# Patient Record
Sex: Female | Born: 1995 | Race: Black or African American | Hispanic: No | Marital: Single
Health system: Southern US, Community
[De-identification: ages and names within clinical notes are randomized; demographics above are authoritative.]

---

## 2018-12-07 ENCOUNTER — Emergency Department (HOSPITAL_COMMUNITY): Payer: BC Managed Care – PPO

## 2018-12-07 ENCOUNTER — Other Ambulatory Visit: Payer: Self-pay

## 2018-12-07 ENCOUNTER — Emergency Department (HOSPITAL_COMMUNITY)
Admission: EM | Admit: 2018-12-07 | Discharge: 2018-12-07 | Disposition: A | Discharge status: Home / Self Care | Payer: BC Managed Care – PPO | Attending: Emergency Medicine | Admitting: Emergency Medicine

## 2018-12-07 DIAGNOSIS — Y929 Unspecified place or not applicable: Secondary | ICD-10-CM | POA: Insufficient documentation

## 2018-12-07 DIAGNOSIS — Y939 Activity, unspecified: Secondary | ICD-10-CM | POA: Insufficient documentation

## 2018-12-07 DIAGNOSIS — W19XXXA Unspecified fall, initial encounter: Secondary | ICD-10-CM | POA: Diagnosis not present

## 2018-12-07 DIAGNOSIS — Y999 Unspecified external cause status: Secondary | ICD-10-CM | POA: Diagnosis not present

## 2018-12-07 DIAGNOSIS — F10920 Alcohol use, unspecified with intoxication, uncomplicated: Secondary | ICD-10-CM | POA: Insufficient documentation

## 2018-12-07 LAB — COMPREHENSIVE METABOLIC PANEL
ALT: 16 U/L (ref 0–44)
AST: 20 U/L (ref 15–41)
Albumin: 4 g/dL (ref 3.5–5.0)
Alkaline Phosphatase: 83 U/L (ref 38–126)
Anion gap: 11 (ref 5–15)
BUN: 8 mg/dL (ref 6–20)
CO2: 24 mmol/L (ref 22–32)
Calcium: 9.1 mg/dL (ref 8.9–10.3)
Chloride: 105 mmol/L (ref 98–111)
Creatinine, Ser: 0.94 mg/dL (ref 0.44–1.00)
GFR calc Af Amer: >60 mL/min (ref >60)
GFR calc non Af Amer: >60 mL/min (ref >60)
Glucose, Bld: 111 mg/dL — ABNORMAL HIGH (ref 70–99)
Potassium: 3.7 mmol/L (ref 3.5–5.1)
Sodium: 140 mmol/L (ref 135–145)
Total Bilirubin: 0.6 mg/dL (ref 0.3–1.2)
Total Protein: 7.5 g/dL (ref 6.5–8.1)

## 2018-12-07 LAB — CBC WITH DIFFERENTIAL/PLATELET
Abs Immature Granulocytes: 0.04 10*3/uL (ref 0.00–0.07)
Basophils Absolute: 0 10*3/uL (ref 0.0–0.1)
Basophils Relative: 0 %
Eosinophils Absolute: 0 10*3/uL (ref 0.0–0.5)
Eosinophils Relative: 0 %
HCT: 39.9 % (ref 36.0–46.0)
Hemoglobin: 12.8 g/dL (ref 12.0–15.0)
Immature Granulocytes: 1 %
Lymphocytes Relative: 24 %
Lymphs Abs: 2.1 10*3/uL (ref 0.7–4.0)
MCH: 29.4 pg (ref 26.0–34.0)
MCHC: 32.1 g/dL (ref 30.0–36.0)
MCV: 91.5 fL (ref 80.0–100.0)
Monocytes Absolute: 0.8 10*3/uL (ref 0.1–1.0)
Monocytes Relative: 9 %
Neutro Abs: 5.7 10*3/uL (ref 1.7–7.7)
Neutrophils Relative %: 66 %
Platelets: 389 10*3/uL (ref 150–400)
RBC: 4.36 MIL/uL (ref 3.87–5.11)
RDW: 13.4 % (ref 11.5–15.5)
WBC: 8.7 10*3/uL (ref 4.0–10.5)
nRBC: 0 % (ref 0.0–0.2)

## 2018-12-07 LAB — I-STAT BETA HCG BLOOD, ED (MC, WL, AP ONLY): I-stat hCG, quantitative: <5 m[IU]/mL (ref <5)

## 2018-12-07 LAB — ETHANOL: Alcohol, Ethyl (B): 203 mg/dL — ABNORMAL HIGH (ref <10)

## 2018-12-07 NOTE — ED Notes (Signed)
Patient transported to CT 

## 2018-12-07 NOTE — ED Notes (Signed)
Patient ambulated to restroom across hall and back without difficulty. Also given ice water for PO challenge - tolerated well, denies nausea at this time.

## 2018-12-07 NOTE — ED Provider Notes (Signed)
[facility] Provider Note   CSN: [id number] Arrival date & time: 12/07/18  0253     History   Chief Complaint Chief Complaint  Patient presents with   Alcohol Intoxication   Fall    HPI Jasmine Cross is a 23 y.o. female.     The history is provided by the patient and medical records.  Alcohol Intoxication  Fall    Level 5 caveat: Intoxication  23 year old female presenting to the ED acutely intoxicated.  She was with friends celebrating her 75st birthday, reportedly had multiple falls and hit her head twice.  There was no loss of consciousness.  Friends called from ENT to have her transported here due to level of intoxication.  Patient does arouse and open eyes to verbal stimuli.  She is not able to answer questions currently.  No past medical history on file.  There are no active problems to display for this patient.    OB History   No obstetric history on file.      Home Medications    Prior to Admission medications   Not on File    Family History No family history on file.  Social History Social History   Tobacco Use   Smoking status: Not on file  Substance Use Topics   Alcohol use: Not on file   Drug use: Not on file     Allergies   Patient has no allergy information on record.   Review of Systems Review of Systems  Unable to perform ROS: Other     Physical Exam Updated Vital Signs BP 111/83 (BP Location: Right Arm)    Pulse 78    Temp 97.9 F (36.6 C) (Oral)    Resp 14    SpO2 98%   Physical Exam Vitals signs and nursing note reviewed.  Constitutional:      Appearance: She is well-developed.     Comments: Sleeping and snoring on stretcher, opens eyes when name is called but rapidly falls back asleep, appears intoxicated  HENT:     Head: Normocephalic and atraumatic.     Comments: No visible head trauma, no hematoma or laceration noted, small abrasion to right cheek that is scabbed  and appears old, no facial deformities noted, dentition appears intact Eyes:     Conjunctiva/sclera: Conjunctivae normal.     Pupils: Pupils are equal, round, and reactive to light.  Neck:     Comments: c-collar in place Cardiovascular:     Rate and Rhythm: Normal rate and regular rhythm.     Heart sounds: Normal heart sounds.  Pulmonary:     Effort: Pulmonary effort is normal.     Breath sounds: Normal breath sounds.  Abdominal:     General: Bowel sounds are normal.     Palpations: Abdomen is soft.  Musculoskeletal: Normal range of motion.  Skin:    General: Skin is warm and dry.  Neurological:     Comments: Somnolent, intoxicated      ED Treatments / Results  Labs (all labs ordered are listed, but only abnormal results are displayed) Labs Reviewed  COMPREHENSIVE METABOLIC PANEL - Abnormal; Notable for the following components:      Result Value   Glucose, Bld 111 (*)    All other components within normal limits  ETHANOL - Abnormal; Notable for the following components:   Alcohol, Ethyl (B) 203 (*)    All other components within normal limits  CBC WITH DIFFERENTIAL/PLATELET  I-STAT BETA  HCG BLOOD, ED ([facility], [facility], [facility] ONLY)    EKG None  Radiology Ct Head Wo Contrast  Result Date: 12/07/2018 CLINICAL DATA:  Alcohol intoxication, post fall EXAM: CT HEAD WITHOUT CONTRAST CT CERVICAL SPINE WITHOUT CONTRAST TECHNIQUE: Multidetector CT imaging of the head and cervical spine was performed following the standard protocol without intravenous contrast. Multiplanar CT image reconstructions of the cervical spine were also generated. COMPARISON:  None. FINDINGS: CT HEAD FINDINGS Brain: Gray-white differentiation is maintained. No CT evidence of acute large territory infarct. Note is made of a approximately 0.5 x 0.3 cm calcification about the anterior table of the right frontal calvarium (image 49, series 5, favored to represent a meningioma, without associated mass effect. No extra-axial  hemorrhage. No intraparenchymal mass or hemorrhage. Normal size and configuration of the ventricles and the basilar cisterns. No midline shift. Vascular: No hyperdense vessel or unexpected calcification. Skull: No displaced calvarial fracture Sinuses/Orbits: Limited visualization of the paranasal sinuses and mastoid air cells is normal. No air-fluid levels. Other: Regional soft tissues appear normal. No radiopaque foreign body. _________________________________________________________ CT CERVICAL SPINE FINDINGS Alignment: C1 to the superior endplate of T3 is imaged. Normal alignment of the cervical spine. No anterolisthesis or retrolisthesis. The bilateral facets are normally aligned. Skull base and vertebrae: The dens is normally positioned between the lateral masses of C1. Normal atlantodental and atlantoaxial articulations. Cervical vertebral body heights are preserved. Soft tissues and spinal canal: Prevertebral soft tissues are normal. Disc levels:  Intervertebral disc space heights are preserved. Upper chest: Limited visualization of the lung apices is normal. Other: Crescentic shaped nodules are seen within the bilateral parotid glands, the left-sided which measures approximately 0.7 cm in greatest short axis diameter (image 30, series 9) and the right-sided of which measures approximately 0.5 cm (image 15), both favored to represent intraparotid lymph nodes. Scattered submental and cervical lymph nodes are numerous though individually not enlarged by size criteria. Normal noncontrast appearance of the thyroid gland. IMPRESSION: 1. No acute intracranial process. 2. No fracture or static subluxation of the cervical spine. Electronically Signed   By: Simonne ComeJohn  Watts M.D.   On: 12/07/2018 04:14   Ct Cervical Spine Wo Contrast  Result Date: 12/07/2018 CLINICAL DATA:  Alcohol intoxication, post fall EXAM: CT HEAD WITHOUT CONTRAST CT CERVICAL SPINE WITHOUT CONTRAST TECHNIQUE: Multidetector CT imaging of the head  and cervical spine was performed following the standard protocol without intravenous contrast. Multiplanar CT image reconstructions of the cervical spine were also generated. COMPARISON:  None. FINDINGS: CT HEAD FINDINGS Brain: Gray-white differentiation is maintained. No CT evidence of acute large territory infarct. Note is made of a approximately 0.5 x 0.3 cm calcification about the anterior table of the right frontal calvarium (image 49, series 5, favored to represent a meningioma, without associated mass effect. No extra-axial hemorrhage. No intraparenchymal mass or hemorrhage. Normal size and configuration of the ventricles and the basilar cisterns. No midline shift. Vascular: No hyperdense vessel or unexpected calcification. Skull: No displaced calvarial fracture Sinuses/Orbits: Limited visualization of the paranasal sinuses and mastoid air cells is normal. No air-fluid levels. Other: Regional soft tissues appear normal. No radiopaque foreign body. _________________________________________________________ CT CERVICAL SPINE FINDINGS Alignment: C1 to the superior endplate of T3 is imaged. Normal alignment of the cervical spine. No anterolisthesis or retrolisthesis. The bilateral facets are normally aligned. Skull base and vertebrae: The dens is normally positioned between the lateral masses of C1. Normal atlantodental and atlantoaxial articulations. Cervical vertebral body heights are preserved. Soft tissues and spinal canal:  Prevertebral soft tissues are normal. Disc levels:  Intervertebral disc space heights are preserved. Upper chest: Limited visualization of the lung apices is normal. Other: Crescentic shaped nodules are seen within the bilateral parotid glands, the left-sided which measures approximately 0.7 cm in greatest short axis diameter (image 30, series 9) and the right-sided of which measures approximately 0.5 cm (image 15), both favored to represent intraparotid lymph nodes. Scattered submental and  cervical lymph nodes are numerous though individually not enlarged by size criteria. Normal noncontrast appearance of the thyroid gland. IMPRESSION: 1. No acute intracranial process. 2. No fracture or static subluxation of the cervical spine. Electronically Signed   By: Simonne Come M.D.   On: 12/07/2018 04:14    Procedures Procedures (including critical care time)  Medications Ordered in ED Medications - No data to display   Initial Impression / Assessment and Plan / ED Course  I have reviewed the triage vital signs and the nursing notes.  Pertinent labs & imaging results that were available during my care of the patient were reviewed by me and considered in my medical decision making (see chart for details).  23 year old female here acutely intoxicated.  She was celebrating with friends for her 23st birthday and had falls x2 with head trauma.  Friend called EMS due to her level of intoxication.  On arrival she does open her eyes to verbal stimuli and name when called, however is not providing any history at this time.  She is falling asleep numerous times during exam.  She does not have any visible signs of head trauma, however given report of falls and level of intoxication will obtain CT of head and neck as well as screening labs.  Will monitor.  5:44 AM Patient much more awake and alert at this time.  She is drinking water and able to walk to bathroom unassisted.  VS remain stable.  We have reviewed head CT, no acute abnormalities.  C-collar was removed and patient was able to range her neck without difficulty.  She is not have any focal neurologic deficits.  So she is stable for discharge home.  Mother will be taking her home and patient will be staying with her today.  Encouraged good oral hydration and responsible alcohol use in the future.  She may return here for any new or acute changes.  Final Clinical Impressions(s) / ED Diagnoses   Final diagnoses:  Alcoholic intoxication without  complication [facility])  Fall, initial encounter    ED Discharge Orders    None       Garlon Hatchet, PA-C 12/07/18 0555    Marily Memos, MD 12/07/18 [phone]

## 2018-12-07 NOTE — Discharge Instructions (Addendum)
Make sure to drink lots of water today, try to make sure you eat. Use alcohol responsibly. Return here for any new/acute changes.

## 2018-12-07 NOTE — ED Triage Notes (Signed)
Came in via ems; reported was picked up from student housing @ [employer]; Reported patient had a 23st birthday celebration and had a lot to drink; friends on scene reported fall x 2 and did hit head both times. C collar in place by EMS; pt has spontaneous chest rise; arousable to stimuli; VSS

## 2018-12-07 NOTE — ED Notes (Signed)
Patient's mom at bedside

## 2018-12-07 NOTE — ED Notes (Signed)
Patient verbalized understanding of discharge instructions and denies any further needs or questions at this time. VS stable. Patient ambulatory with steady gait. Assisted to ED entrance in wheelchair.   

## 2020-06-22 IMAGING — CT CT CERVICAL SPINE W/O CM
3 of 4 series · 13 of 35 positions shown, 16 images · non-contrast
Comparison: None.

CLINICAL DATA: Alcohol intoxication, post fall

EXAM:
CT HEAD WITHOUT CONTRAST
CT CERVICAL SPINE WITHOUT CONTRAST
TECHNIQUE: Multidetector CT imaging of the head and cervical spine was
performed following the standard protocol without intravenous
contrast. Multiplanar CT image reconstructions of the cervical spine
were also generated.

[Series 10: c_spine 2.0 sag bone · sagittal · 0.35mm/px · 5 of 61 slices shown, 6 images]
[im 21/61  bone]
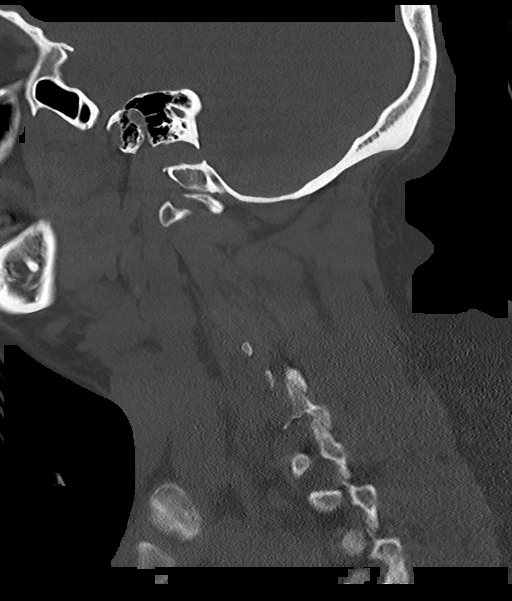
[im 26/61  bone]
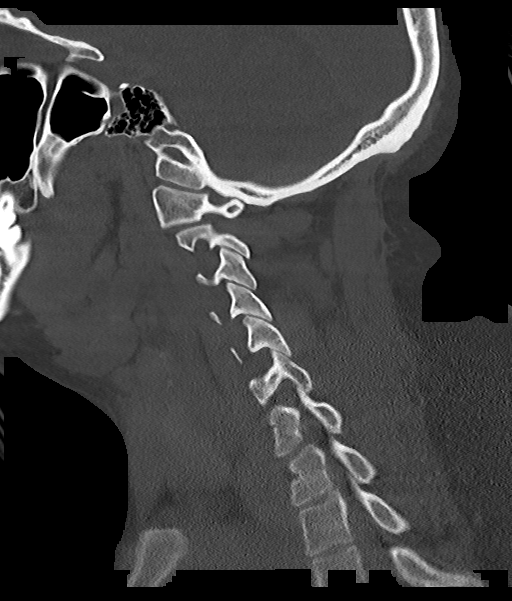
[im 31/61  soft-tissue]
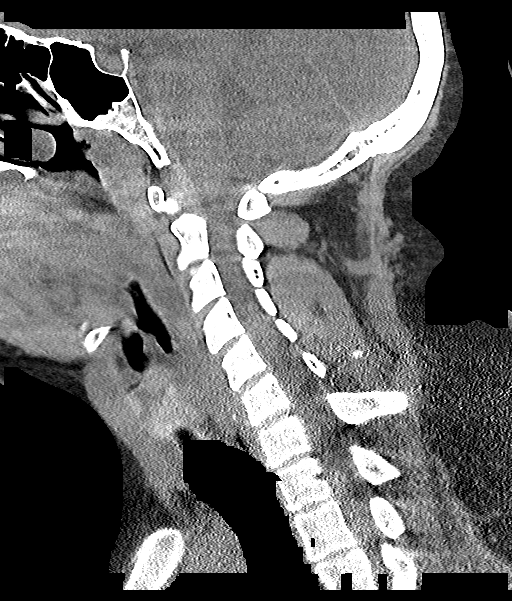
[im 31/61  bone]
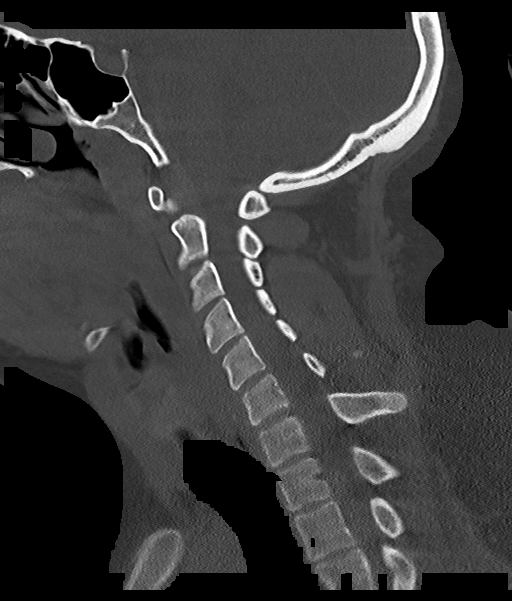
[im 36/61  bone]
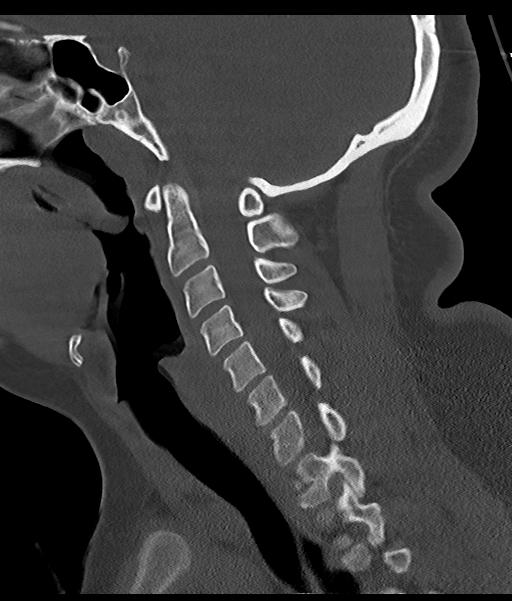
[im 41/61  bone]
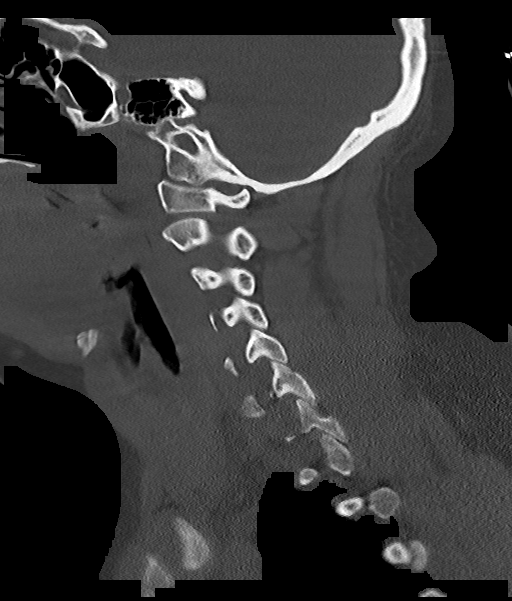

[Series 11: c_spine 2.0 cor bone · coronal · 0.29mm/px · 3 of 61 slices shown]
[im 13/61  bone]
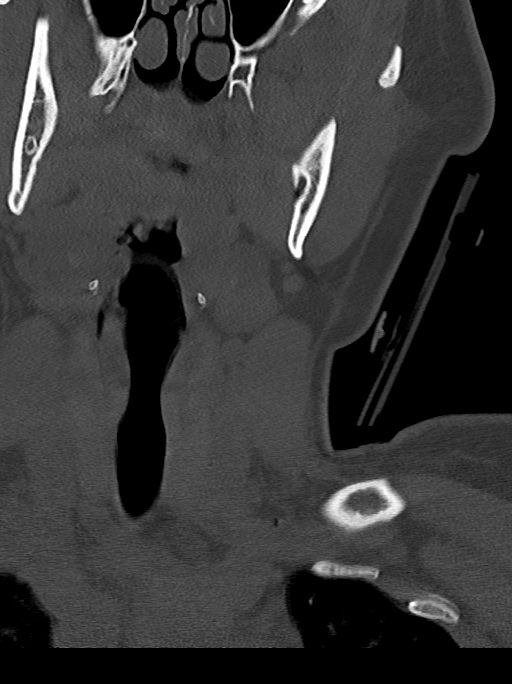
[im 25/61  bone]
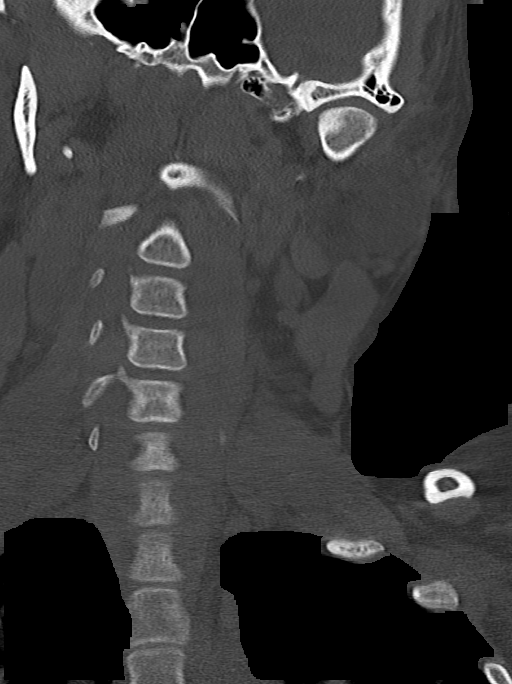
[im 36/61  bone]
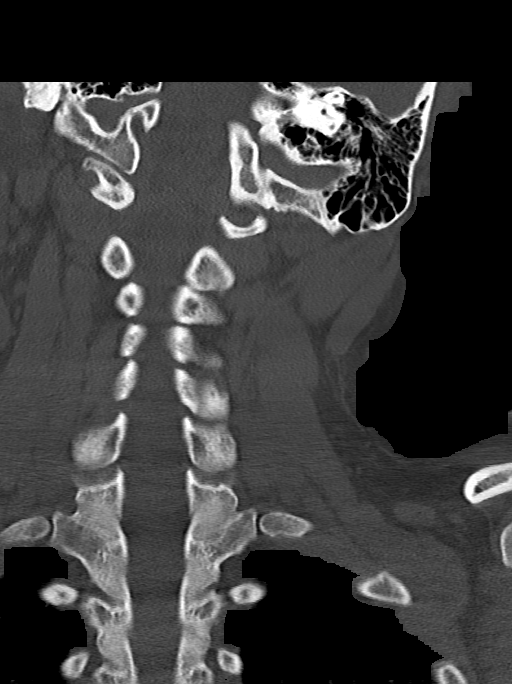

[Series 13: c_spine 1.0 st thins · axial · 0.43mm/px · z∈[-228,-69]mm · 5 of 284 slices shown, 7 images]
[im 29/284  soft-tissue]
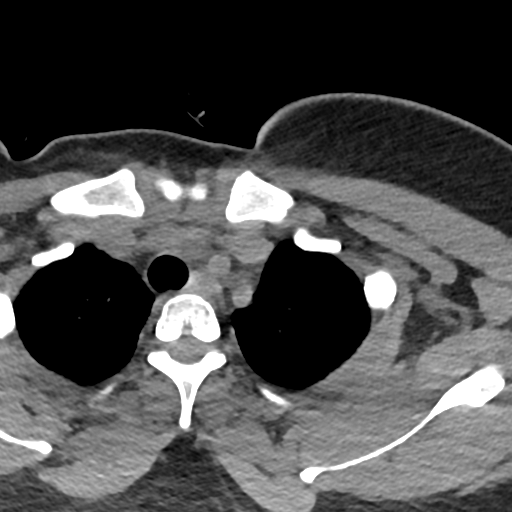
[im 29/284  bone]
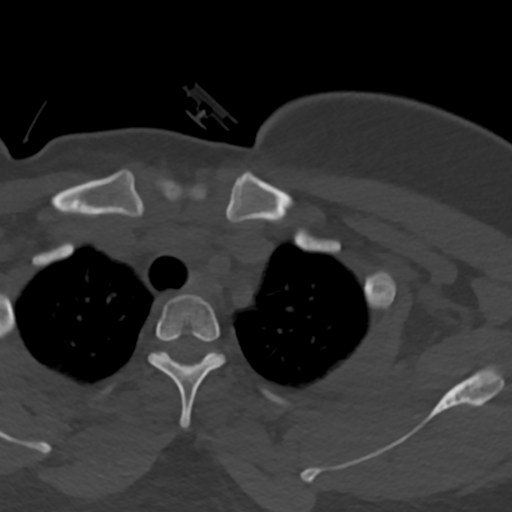
[im 85/284  bone]
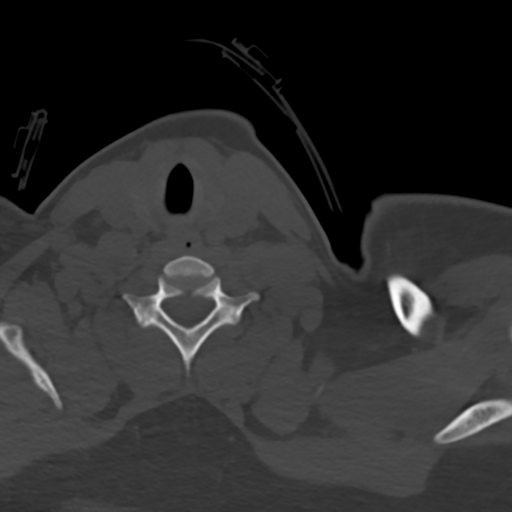
[im 142/284  bone]
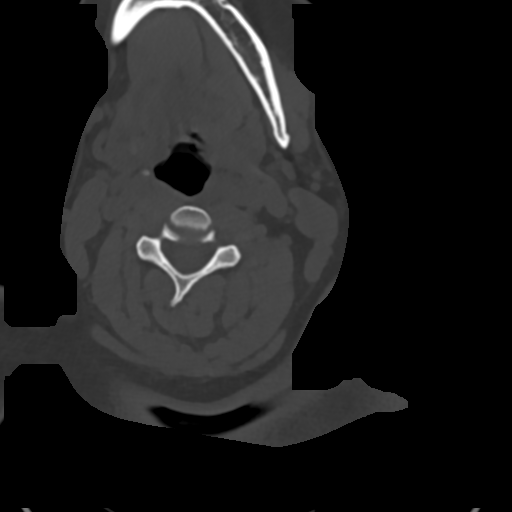
[im 199/284  bone]
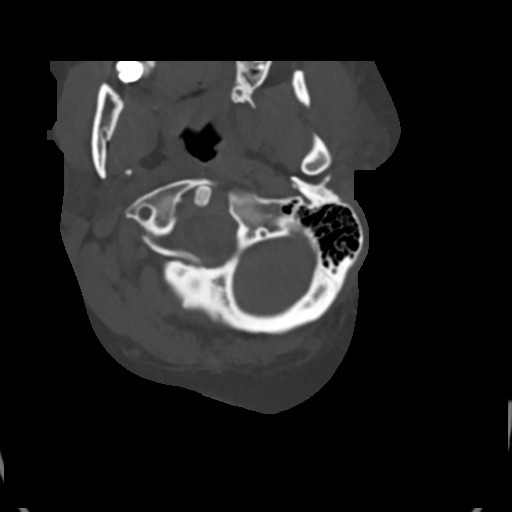
[im 255/284  soft-tissue]
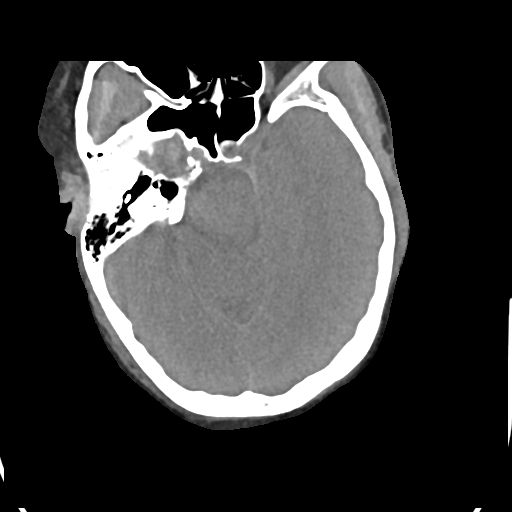
[im 255/284  bone]
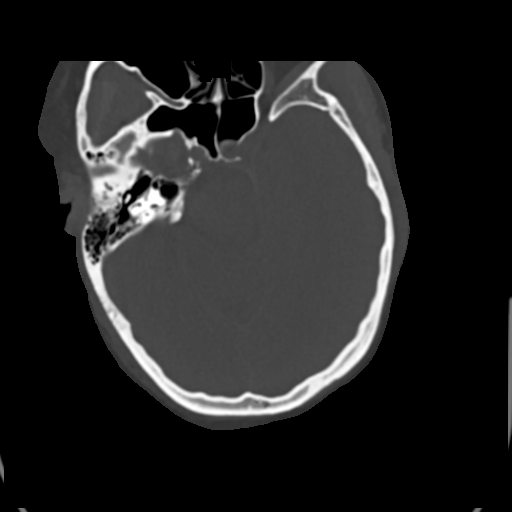

[13 of 35 positions shown; findings below may reference images not displayed]

FINDINGS: CT HEAD FINDINGS

Brain: Gray-white differentiation is maintained. No CT evidence of
acute large territory infarct. Note is made of a approximately 0.5 x
0.3 cm calcification about the anterior table of the right frontal
calvarium (image 49, series 5, favored to represent a meningioma,
without associated mass effect. No extra-axial hemorrhage. No
intraparenchymal mass or hemorrhage. Normal size and configuration
of the ventricles and the basilar cisterns. No midline shift.

Vascular: No hyperdense vessel or unexpected calcification.

Skull: No displaced calvarial fracture

Sinuses/Orbits: Limited visualization of the paranasal sinuses and
mastoid air cells is normal. No air-fluid levels.

Other: Regional soft tissues appear normal. No radiopaque foreign
body.

_________________________________________________________

CT CERVICAL SPINE FINDINGS

Alignment: C1 to the superior endplate of T3 is imaged. Normal
alignment of the cervical spine. No anterolisthesis or
retrolisthesis. The bilateral facets are normally aligned.

Skull base and vertebrae: The dens is normally positioned between
the lateral masses of C1. Normal atlantodental and atlantoaxial
articulations.

Cervical vertebral body heights are preserved.

Soft tissues and spinal canal: Prevertebral soft tissues are normal.

Disc levels:  Intervertebral disc space heights are preserved.

Upper chest: Limited visualization of the lung apices is normal.

Other: Crescentic shaped nodules are seen within the bilateral
parotid glands, the left-sided which measures approximately 0.7 cm
in greatest short axis diameter (image 30, series 9) and the
right-sided of which measures approximately 0.5 cm (image 15), both
favored to represent intraparotid lymph nodes. Scattered submental
and cervical lymph nodes are numerous though individually not
enlarged by size criteria. Normal noncontrast appearance of the
thyroid gland.
IMPRESSION: 1. No acute intracranial process.
2. No fracture or static subluxation of the cervical spine.
